# Patient Record
Sex: Female | Born: 1960 | Race: White | Hispanic: No | Smoking: Never smoker
Health system: Southern US, Community
[De-identification: ages and names within clinical notes are randomized; demographics above are authoritative.]

## PROBLEM LIST (undated history)

## (undated) DIAGNOSIS — T148XXA Other injury of unspecified body region, initial encounter: Secondary | ICD-10-CM

## (undated) DIAGNOSIS — D219 Benign neoplasm of connective and other soft tissue, unspecified: Secondary | ICD-10-CM

## (undated) DIAGNOSIS — F99 Mental disorder, not otherwise specified: Secondary | ICD-10-CM

## (undated) HISTORY — PX: CATARACT EXTRACTION: SUR2

## (undated) HISTORY — PX: WISDOM TOOTH EXTRACTION: SHX21

## (undated) HISTORY — PX: PARATHYROIDECTOMY: SHX19

## (undated) HISTORY — DX: Other injury of unspecified body region, initial encounter: T14.8XXA

## (undated) HISTORY — DX: Mental disorder, not otherwise specified: F99

## (undated) HISTORY — PX: CYSTECTOMY: SUR359

## (undated) HISTORY — DX: Benign neoplasm of connective and other soft tissue, unspecified: D21.9

---

## 2004-04-16 ENCOUNTER — Other Ambulatory Visit: Admission: RE | Admit: 2004-04-16 | Discharge: 2004-04-16 | Payer: Self-pay | Admitting: Obstetrics and Gynecology

## 2004-04-30 ENCOUNTER — Ambulatory Visit (HOSPITAL_COMMUNITY): Admission: RE | Admit: 2004-04-30 | Discharge: 2004-04-30 | Payer: Self-pay | Admitting: Obstetrics and Gynecology

## 2005-04-16 ENCOUNTER — Other Ambulatory Visit: Admission: RE | Admit: 2005-04-16 | Discharge: 2005-04-16 | Payer: Self-pay | Admitting: Obstetrics and Gynecology

## 2005-05-11 ENCOUNTER — Ambulatory Visit (HOSPITAL_COMMUNITY): Admission: RE | Admit: 2005-05-11 | Discharge: 2005-05-11 | Payer: Self-pay | Admitting: Obstetrics and Gynecology

## 2006-01-06 ENCOUNTER — Encounter (HOSPITAL_COMMUNITY): Admission: RE | Admit: 2006-01-06 | Discharge: 2006-02-25 | Payer: Self-pay | Admitting: General Surgery

## 2006-02-26 ENCOUNTER — Encounter (INDEPENDENT_AMBULATORY_CARE_PROVIDER_SITE_OTHER): Payer: Self-pay | Admitting: *Deleted

## 2006-02-26 ENCOUNTER — Ambulatory Visit (HOSPITAL_COMMUNITY): Admission: RE | Admit: 2006-02-26 | Discharge: 2006-02-26 | Payer: Self-pay | Admitting: General Surgery

## 2006-06-21 ENCOUNTER — Ambulatory Visit (HOSPITAL_COMMUNITY): Admission: RE | Admit: 2006-06-21 | Discharge: 2006-06-21 | Payer: Self-pay | Admitting: Obstetrics and Gynecology

## 2007-07-20 ENCOUNTER — Ambulatory Visit (HOSPITAL_COMMUNITY): Admission: RE | Admit: 2007-07-20 | Discharge: 2007-07-20 | Payer: Self-pay | Admitting: Obstetrics and Gynecology

## 2008-09-06 ENCOUNTER — Ambulatory Visit (HOSPITAL_COMMUNITY): Admission: RE | Admit: 2008-09-06 | Discharge: 2008-09-06 | Payer: Self-pay | Admitting: Obstetrics and Gynecology

## 2009-09-13 ENCOUNTER — Ambulatory Visit (HOSPITAL_COMMUNITY): Admission: RE | Admit: 2009-09-13 | Discharge: 2009-09-13 | Payer: Self-pay | Admitting: Family Medicine

## 2010-10-31 NOTE — Op Note (Signed)
NAMEALASKA, FLETT                 ACCOUNT NO.:  [id number]   MEDICAL RECORD NO.:  [id number]          PATIENT TYPE:  AMB   LOCATION:  SDS                          FACILITY:  [facility]   PHYSICIAN:  Anselm Pancoast. 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   POSTOPERATIVE DIAGNOSIS:  Parathyroid adenoma, right inferior, large gland.   SURGEONS:  Anselm Pancoast. Zachery Dakins, M.D.  Currie Paris, M.D.   HISTORY:  Amy Bernard is a 50 year old female, who is moderately  overweight, referred to me by Herb Grays for elevated calcium with a PTH  level of about 120.  This was picked up on routine chemistries.  She had  been treated for a spider bite with some infections, and was continuing on a  course of prednisone when I saw her in July.  She completed that.  We  discussed getting a Sestamibi scan, which was obtained, and this showed  increased activity in the right inferior, consistent with a parathyroid  adenoma.  She has completed the steroids.  The infection in the lower  extremity has healed, and she is here today for the procedure.  We are  hopeful to be able to do this with a small incision with the help of the  radioactive injection preoperatively.   DESCRIPTION OF PROCEDURE:  Preoperatively, she was given 1 g Ancef.  She has  had question if she is allergic to penicillin; no reaction, and then she was  taken back to the operating suite.  She was positioned on the OR table, and  with the [facility] counter preoperatively before she was asleep, on checking  the counts, there was about a 150 count higher on the right inferior than  the left.  The counts were about 350 along the neck in all 4 quadrants.  Induced with general anesthesia, endotracheal tube.  She is a large  individual, and a roll was  placed under the back.  Next, the neck was  prepped with Betadine Surgical Solution and then draped in sterile manner.  An area was marked about 2 fingerbreadths above the manubrium, about a  fingerbreadth laterally, and I marked the incision as if I was going to do a  total thyroidectomy type of incision.  And, of course, she had been draped  sterilely.  I opened up only the right side of the area and went straight  down through the platysma, and then elevated this.  Dr. Jamey Ripa had scrubbed  in at this point, and then, elevating the platysma, I went and actually  divided a few fibers of the strap muscles on the right, and then went  directly down, making a separation instead of going medially and retracting  it laterally, as we would normally do for a usual thyroidectomy.  The  thyroid appeared normal.  It was a fairly prominent gland, but nice and  soft.  The vein and carotid artery were lateral, and  then we carefully  dissected and elevated so that I could get my finger in the inferior aspect  of the thyroid.  Using the probe at this point, we could still get a count  of about 150 higher than the surrounding tissue, but as far as actually  seeing anything that looked like obviously an adenoma, we could not.  We  carefully elevated the inferior lobe of the thyroid on the right, looking at  the posterior surface of this, and could not see or get any high counts in  this area.  Then, working down to what looked like thymus tissue coming up  in the superior mediastinal area, we continued dissecting.  All we could see  looked like more fatty tissue than the usual kind of brownish color of a  parathyroid adenoma.  We continued with basically sharp and blunt  dissection.  There were a few little veins, very small, that were elevated,  and little baby clips were placed and then divided, allowing [place] to go right  down in this area.  And then, at lateral to the trachea, we were finally  able to  dissect up something that looked slightly different than the  surrounding fatty tissue.  This was continued to be dissected, and then it  was thought that this obviously was the parathyroid; it was large, about 2 x  probably 2.5 cm, and about 1 cm thick; but it was a whole lot more fatty  tissue than the usual little parathyroid typical adenoma.  We continued, and  the little vessel that was supplying it was coming laterally, and this was  clipped with a baby clip and divided.  Then, using the gland-side of the  vessel, we used that as a retractor.  We continued elevating in the area of  our small incision.  Exposure was not easy.  And then we used a Babcock to  actually grab the gland and then dissected, dividing very carefully the  little areolar tissue around it, and then the area could be withdrawn  completely.  It appeared intact.  This was down and Dr. Laureen Ochs examined and  said it was definitely parathyroid tissue, probably consistent with an  adenoma.  There was good hemostasis.  We did place a little piece of  Surgicel in the wound, waited a few minutes, and then closed the area.  First, I approximated the strap muscles where I divided with a couple  interrupted sutures of 3-0 Vicryl.  The platysma was closed with interrupted  3-0 Vicryl, and then a 4-0 Vicryl subcuticular, and the third layer, Steri-  Strips and benzoin on the skin.  The patient tolerated the procedure nicely,  was awakened, had good voice, no difficulty breathing, and she will be  released after a stay in Short Stay this afternoon if there is no problems  with breathing or bleeding.  The patient will return to our office in about  10 days, and I will follow a serum calcium at that time.           ______________________________  Anselm Pancoast. Zachery Dakins, M.D.     WJW/MEDQ  D:  02/26/2006  T:  02/26/2006  Job:  [id number]   cc:   Tammy R. Collins [place], M.D. Currie Paris, M.D.

## 2011-02-06 ENCOUNTER — Other Ambulatory Visit (HOSPITAL_COMMUNITY): Payer: Self-pay | Admitting: Family Medicine

## 2011-02-06 DIAGNOSIS — Z1231 Encounter for screening mammogram for malignant neoplasm of breast: Secondary | ICD-10-CM

## 2011-02-13 ENCOUNTER — Ambulatory Visit (HOSPITAL_COMMUNITY)
Admission: RE | Admit: 2011-02-13 | Discharge: 2011-02-13 | Disposition: A | Discharge status: Home / Self Care | Payer: 59 | Source: Ambulatory Visit | Attending: Family Medicine | Admitting: Family Medicine

## 2011-02-13 DIAGNOSIS — Z1231 Encounter for screening mammogram for malignant neoplasm of breast: Secondary | ICD-10-CM | POA: Insufficient documentation

## 2012-04-22 ENCOUNTER — Other Ambulatory Visit (HOSPITAL_COMMUNITY): Payer: Self-pay | Admitting: Family Medicine

## 2012-04-22 DIAGNOSIS — Z1231 Encounter for screening mammogram for malignant neoplasm of breast: Secondary | ICD-10-CM

## 2012-05-20 ENCOUNTER — Ambulatory Visit (HOSPITAL_COMMUNITY)
Admission: RE | Admit: 2012-05-20 | Discharge: 2012-05-20 | Disposition: A | Discharge status: Home / Self Care | Payer: 59 | Source: Ambulatory Visit | Attending: Family Medicine | Admitting: Family Medicine

## 2012-05-20 DIAGNOSIS — Z1231 Encounter for screening mammogram for malignant neoplasm of breast: Secondary | ICD-10-CM | POA: Insufficient documentation

## 2012-06-13 ENCOUNTER — Telehealth: Payer: Self-pay | Admitting: Obstetrics and Gynecology

## 2012-06-23 ENCOUNTER — Encounter: Payer: Self-pay | Admitting: Obstetrics and Gynecology

## 2012-06-24 ENCOUNTER — Encounter: Payer: Self-pay | Admitting: Obstetrics and Gynecology

## 2012-06-27 ENCOUNTER — Ambulatory Visit (INDEPENDENT_AMBULATORY_CARE_PROVIDER_SITE_OTHER): Payer: 59 | Admitting: Obstetrics and Gynecology

## 2012-06-27 ENCOUNTER — Encounter: Payer: Self-pay | Admitting: Obstetrics and Gynecology

## 2012-06-27 VITALS — BP 138/80 | HR 82 | Wt 208.0 lb

## 2012-06-27 DIAGNOSIS — N951 Menopausal and female climacteric states: Secondary | ICD-10-CM

## 2012-06-27 NOTE — Progress Notes (Signed)
Menopausal symptoms:anxiety, moodiness  The patient is not taking hormone replacement therapy The patient  is taking a Calcium supplement. The patient participates in regular exercise: yes. Post-menopausal bleeding:yes:  PT IS STILL HAVING A CYCLE; LAST ONE IN NOVEMBER  The patient is not sexually active.  Last Pap: was normal September  2012 WITH DR. Herb Grays Last mammogram: was normal December  2013  History of DVT/PE: No Family history of breast cancer: No Family history of endometrial cancer:No

## 2012-06-27 NOTE — Progress Notes (Signed)
Subjective:    Amy Bernard is a 52 y.o. female G2P0 who presents for annual exam. The patient has no complaints.  Reports that she had her last period in November lasting 3 days followed by spotting for up to 7 days (only minimal cramping). Denies vaginal dryness, hot flushes, significant mood swings, sleeping difficulties, focusing problems, or memory lapses. Admits to occasional anxiety and change in menses.  Had a very heavy/painful flow x 1 a year ago that kept her home from work x 1 day but none since. Periods will occur approximately every 3-4 months.  States she had a pelvic ultrasound and labs done a month ago with her  [facility] = 71 per patient  (05/2012).   Review of Systems Gastrointestinal:No change in bowel habits, no abdominal pain, no rectal bleeding Genitourinary:negative for heavy bleeding,  dysuria, frequency, hematuria, nocturia and urinary incontinence   Objective:     BP 138/80  Pulse 82  Wt 208 lb (94.348 kg)  LMP 04/19/2012 Weight:  Wt Readings from Last 1 Encounters:  06/27/12 208 lb (94.348 kg)   There is no height on file to calculate BMI.   Assessment:    Peri- Menopausal    Plan:  ROI labs, ultrasound and office visit notes from Dr. Herb Grays from last month  Urged patient to develop some renewal time  RTO 1 year or prn   Yuval Rubens,ELMIRAPA-C

## 2012-07-13 ENCOUNTER — Telehealth: Payer: Self-pay | Admitting: Obstetrics and Gynecology

## 2012-07-13 NOTE — Telephone Encounter (Signed)
Call to patient to advise that we had gotten her records from Dr. Collins Scotland but not the actual ultrasound report so that we could see what the thickness of her endometrium happened to be.  There was mention in Dr. Alda Berthold note that she had a small fibroid but no other details.  Patient states she will request that they send that report to [place].  Winston Misner, PA-C

## 2014-04-16 ENCOUNTER — Encounter: Payer: Self-pay | Admitting: Obstetrics and Gynecology

## 2014-09-21 ENCOUNTER — Other Ambulatory Visit (HOSPITAL_COMMUNITY): Payer: Self-pay | Admitting: Nurse Practitioner

## 2014-09-21 DIAGNOSIS — Z1231 Encounter for screening mammogram for malignant neoplasm of breast: Secondary | ICD-10-CM

## 2014-09-26 ENCOUNTER — Ambulatory Visit (HOSPITAL_COMMUNITY)
Admission: RE | Admit: 2014-09-26 | Discharge: 2014-09-26 | Disposition: A | Discharge status: Home / Self Care | Payer: 59 | Source: Ambulatory Visit | Attending: Nurse Practitioner | Admitting: Nurse Practitioner

## 2014-09-26 DIAGNOSIS — Z1231 Encounter for screening mammogram for malignant neoplasm of breast: Secondary | ICD-10-CM | POA: Insufficient documentation

## 2016-07-10 ENCOUNTER — Other Ambulatory Visit: Payer: Self-pay | Admitting: Physician Assistant

## 2016-07-10 DIAGNOSIS — Z1231 Encounter for screening mammogram for malignant neoplasm of breast: Secondary | ICD-10-CM

## 2016-07-30 ENCOUNTER — Ambulatory Visit
Admission: RE | Admit: 2016-07-30 | Discharge: 2016-07-30 | Disposition: A | Discharge status: Home / Self Care | Payer: Self-pay | Source: Ambulatory Visit | Attending: Physician Assistant | Admitting: Physician Assistant

## 2016-07-30 DIAGNOSIS — Z1231 Encounter for screening mammogram for malignant neoplasm of breast: Secondary | ICD-10-CM

## 2017-10-26 ENCOUNTER — Other Ambulatory Visit: Payer: Self-pay | Admitting: Physician Assistant

## 2017-10-26 DIAGNOSIS — Z1231 Encounter for screening mammogram for malignant neoplasm of breast: Secondary | ICD-10-CM

## 2017-11-17 ENCOUNTER — Ambulatory Visit
Admission: RE | Admit: 2017-11-17 | Discharge: 2017-11-17 | Disposition: A | Discharge status: Home / Self Care | Payer: 59 | Source: Ambulatory Visit | Attending: Physician Assistant | Admitting: Physician Assistant

## 2017-11-17 DIAGNOSIS — Z1231 Encounter for screening mammogram for malignant neoplasm of breast: Secondary | ICD-10-CM

## 2018-10-17 ENCOUNTER — Other Ambulatory Visit: Payer: Self-pay | Admitting: Physician Assistant

## 2018-10-17 DIAGNOSIS — Z1231 Encounter for screening mammogram for malignant neoplasm of breast: Secondary | ICD-10-CM

## 2018-12-12 ENCOUNTER — Other Ambulatory Visit: Payer: Self-pay

## 2018-12-12 ENCOUNTER — Ambulatory Visit
Admission: RE | Admit: 2018-12-12 | Discharge: 2018-12-12 | Disposition: A | Discharge status: Home / Self Care | Payer: BC Managed Care – PPO | Source: Ambulatory Visit | Attending: Physician Assistant | Admitting: Physician Assistant

## 2018-12-12 DIAGNOSIS — Z1231 Encounter for screening mammogram for malignant neoplasm of breast: Secondary | ICD-10-CM

## 2019-07-30 ENCOUNTER — Ambulatory Visit: Payer: BC Managed Care – PPO

## 2019-08-24 ENCOUNTER — Ambulatory Visit: Payer: BC Managed Care – PPO | Attending: Internal Medicine

## 2019-08-24 DIAGNOSIS — Z23 Encounter for immunization: Secondary | ICD-10-CM

## 2019-08-24 NOTE — Progress Notes (Signed)
   Covid-19 Vaccination Clinic  Name:  Amy Bernard    MRN: [id number] DOB: Feb 26, 1961  08/24/2019  [name] was observed post Covid-19 immunization for 15 minutes without incident. She was provided with Vaccine Information Sheet and instruction to access the V-Safe system.   [name] was instructed to call 911 with any severe reactions post vaccine: [name] Kitchen Difficulty breathing  . Swelling of face and throat  . A fast heartbeat  . A bad rash all over body  . Dizziness and weakness   Immunizations Administered    Name Date Dose VIS Date Route   Pfizer COVID-19 Vaccine 08/24/2019 12:24 PM 0.3 mL 05/26/2019 Intramuscular   Manufacturer: [place]   Lot: [id number]   [place]: [id number]

## 2019-09-18 ENCOUNTER — Ambulatory Visit: Payer: BC Managed Care – PPO | Attending: Internal Medicine

## 2019-09-18 DIAGNOSIS — Z23 Encounter for immunization: Secondary | ICD-10-CM

## 2019-09-18 NOTE — Progress Notes (Signed)
   Covid-19 Vaccination Clinic  Name:  Amy Bernard    MRN: [id number] DOB: January 31, 1961  09/18/2019  [name] was observed post Covid-19 immunization for 15 minutes without incident. She was provided with Vaccine Information Sheet and instruction to access the V-Safe system.   [name] was instructed to call 911 with any severe reactions post vaccine: [name] Kitchen Difficulty breathing  . Swelling of face and throat  . A fast heartbeat  . A bad rash all over body  . Dizziness and weakness   Immunizations Administered    Name Date Dose VIS Date Route   Pfizer COVID-19 Vaccine 09/18/2019 12:13 PM 0.3 mL 05/26/2019 Intramuscular   Manufacturer: [place]   Lot: [id number]   [place]: [id number]

## 2021-05-04 IMAGING — MG DIGITAL SCREENING BILATERAL MAMMOGRAM WITH TOMO AND CAD
6 of 10 series · 6 of 30 positions shown · non-contrast
Comparison: Previous exam(s).

CLINICAL DATA: Screening.

EXAM:
DIGITAL SCREENING BILATERAL MAMMOGRAM WITH TOMO AND CAD

[L MLO synth-2D]
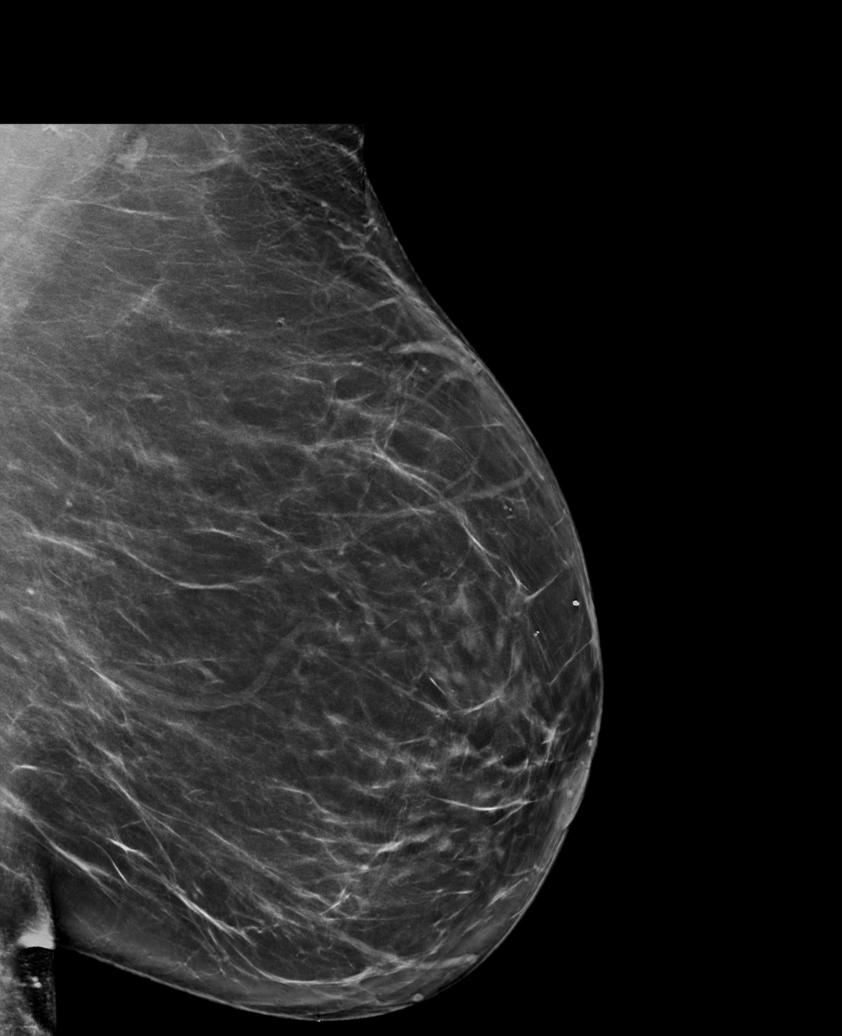

[L CC synth-2D (1 of 2)]
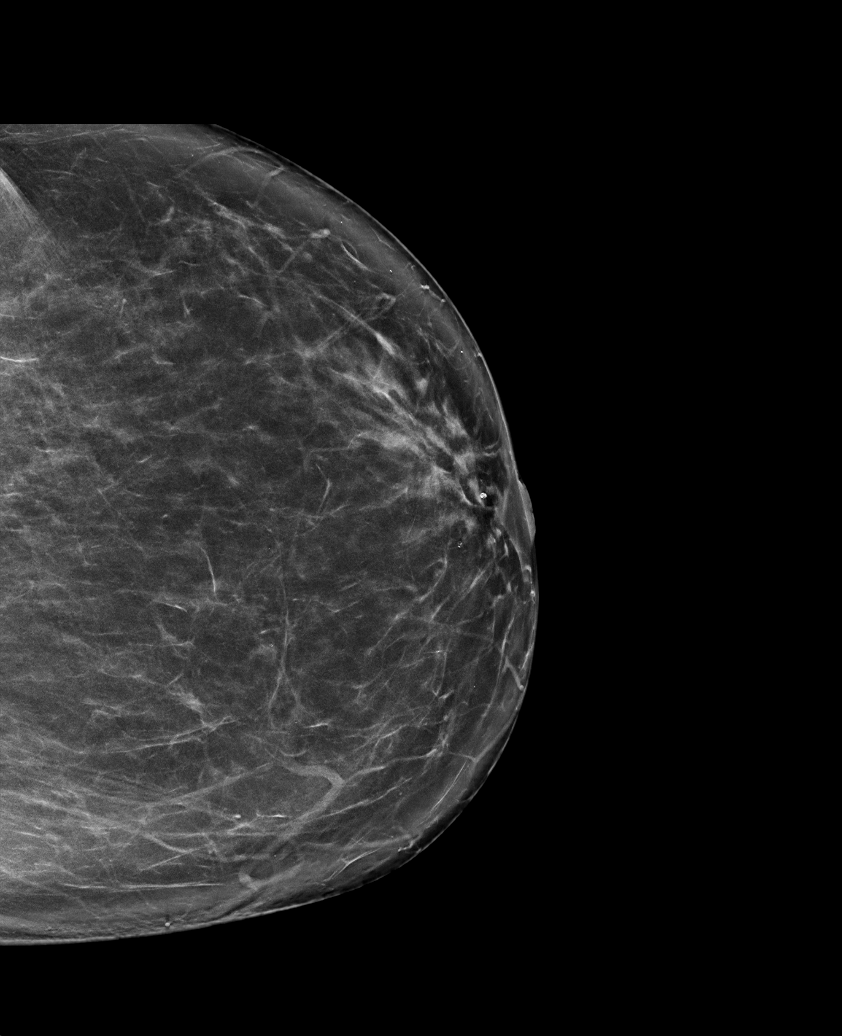

[L CC synth-2D (2 of 2)]
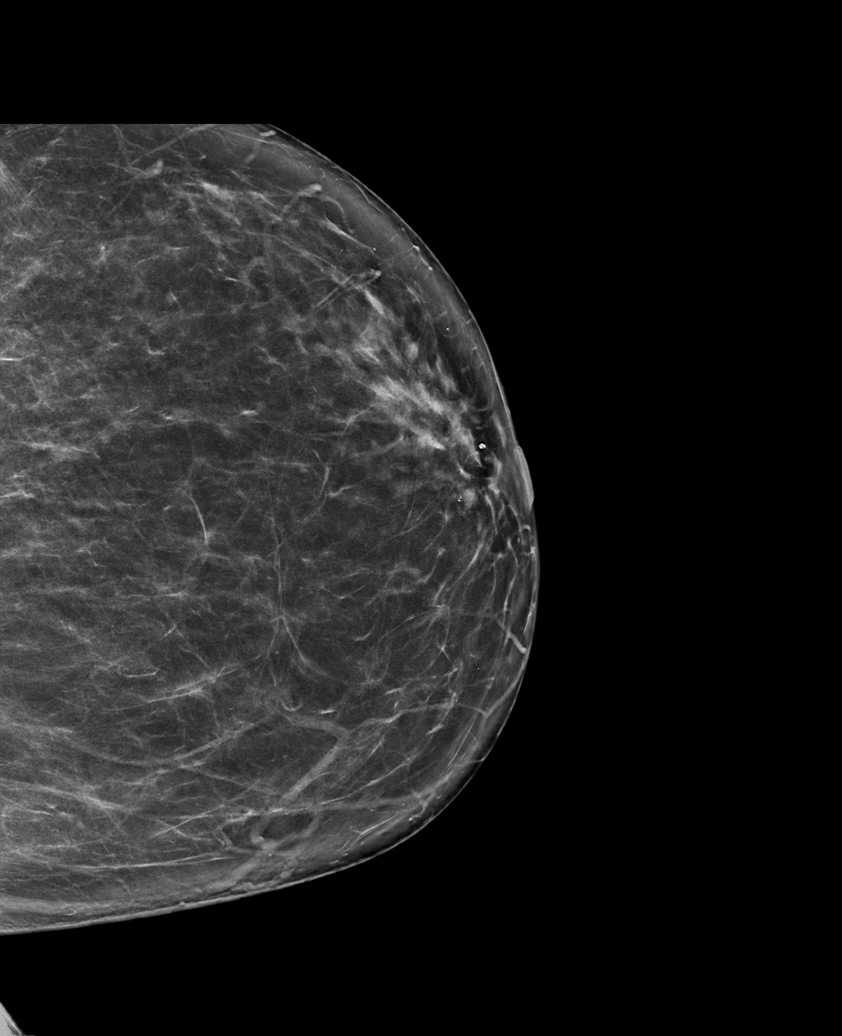

[R CC synth-2D]
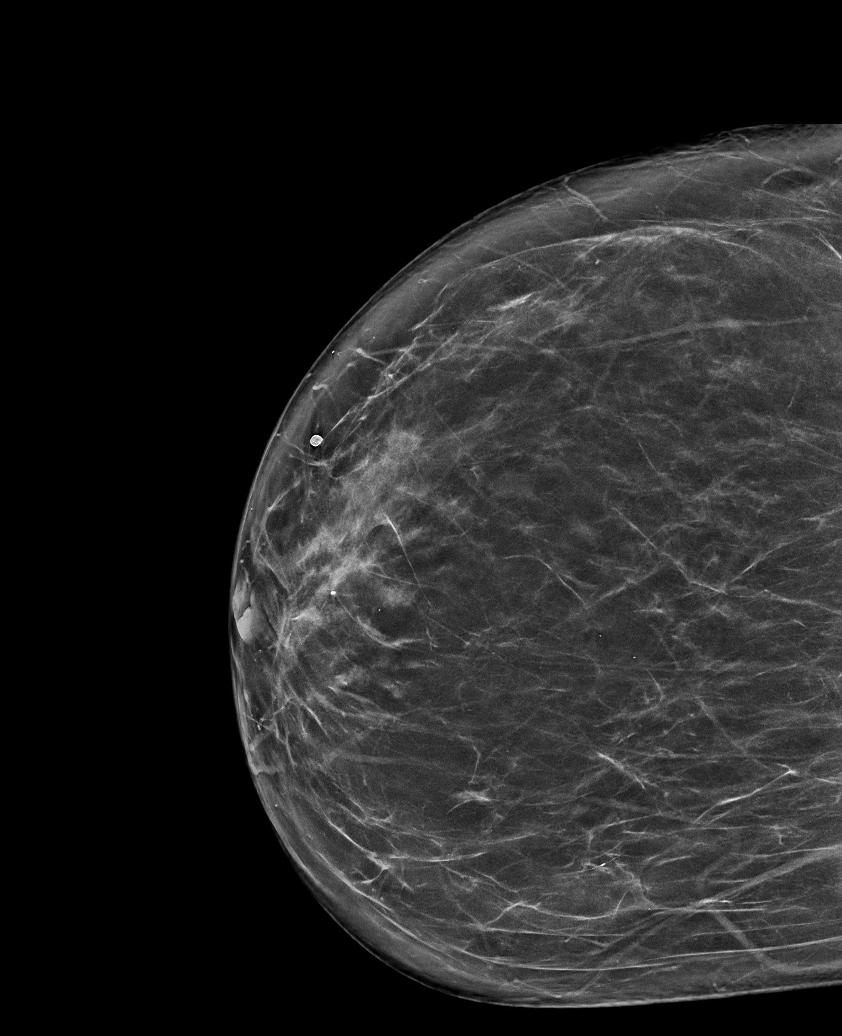

[R MLO synth-2D]
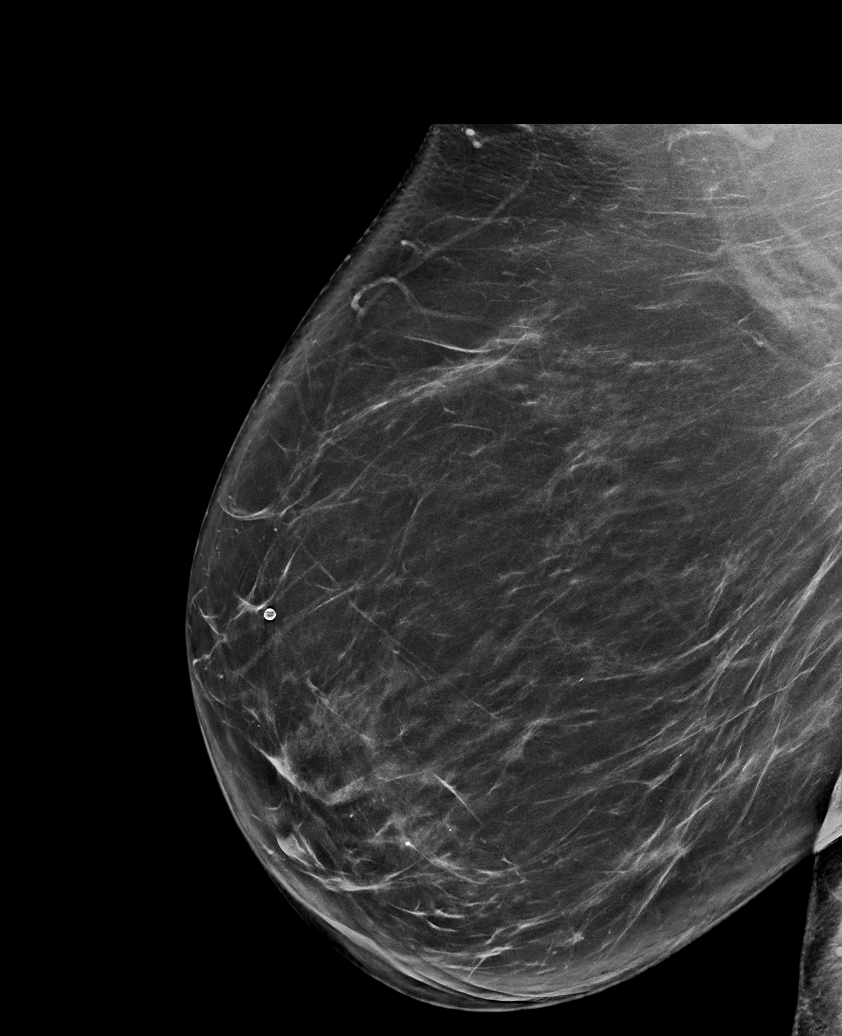

[L CC tomo · tomo slice 41/82.0]
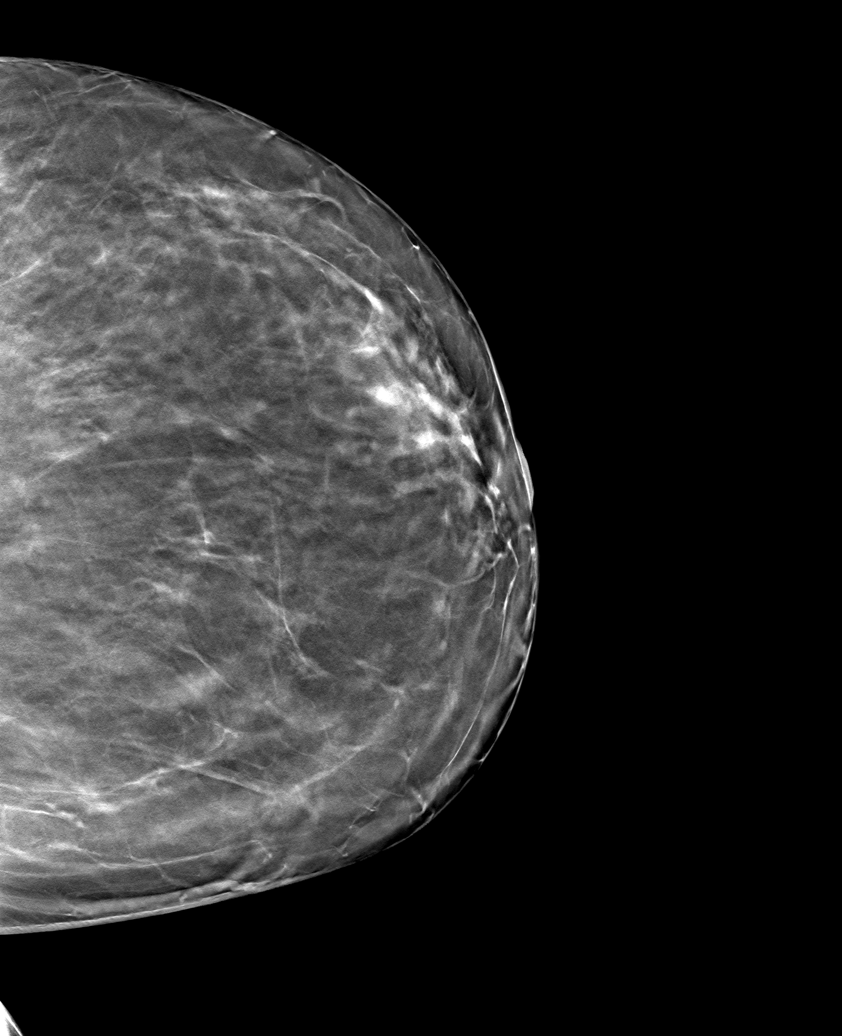

[6 of 30 positions shown; findings below may reference images not displayed]

ACR Breast Density Category b: There are scattered areas of
fibroglandular density.
FINDINGS: There are no findings suspicious for malignancy. Images were
processed with CAD.
IMPRESSION: No mammographic evidence of malignancy. A result letter of this
screening mammogram will be mailed directly to the patient.

RECOMMENDATION:
Screening mammogram in one year. (Code:[id number])

BI-RADS CATEGORY  1: Negative.
